# Patient Record
Sex: Male | Born: 1990 | Race: Black or African American | Hispanic: No | Marital: Married | Smoking: Former smoker
Health system: Southern US, Community
[De-identification: ages and names within clinical notes are randomized; demographics above are authoritative.]

## PROBLEM LIST (undated history)

## (undated) DIAGNOSIS — K219 Gastro-esophageal reflux disease without esophagitis: Secondary | ICD-10-CM

## (undated) DIAGNOSIS — N469 Male infertility, unspecified: Secondary | ICD-10-CM

## (undated) HISTORY — PX: OTHER SURGICAL HISTORY: SHX169

## (undated) HISTORY — PX: NO PAST SURGERIES: SHX2092

---

## 2017-08-11 ENCOUNTER — Ambulatory Visit
Admission: RE | Admit: 2017-08-11 | Discharge: 2017-08-11 | Disposition: A | Discharge status: Home / Self Care | Payer: No Typology Code available for payment source | Source: Ambulatory Visit | Attending: Occupational Medicine | Admitting: Occupational Medicine

## 2017-08-11 ENCOUNTER — Other Ambulatory Visit: Payer: Self-pay | Admitting: Occupational Medicine

## 2017-08-11 DIAGNOSIS — Z021 Encounter for pre-employment examination: Secondary | ICD-10-CM

## 2019-01-23 IMAGING — CR DG CHEST 1V
1 series · 1 of 1 positions shown · non-contrast
Comparison: None in PACs

CLINICAL DATA: PRE EMPLOYMENT examination for [HOSPITAL] police
department. Nonsmoker. No complaints.

EXAM:
CHEST 1 VIEW

[w chest pa]
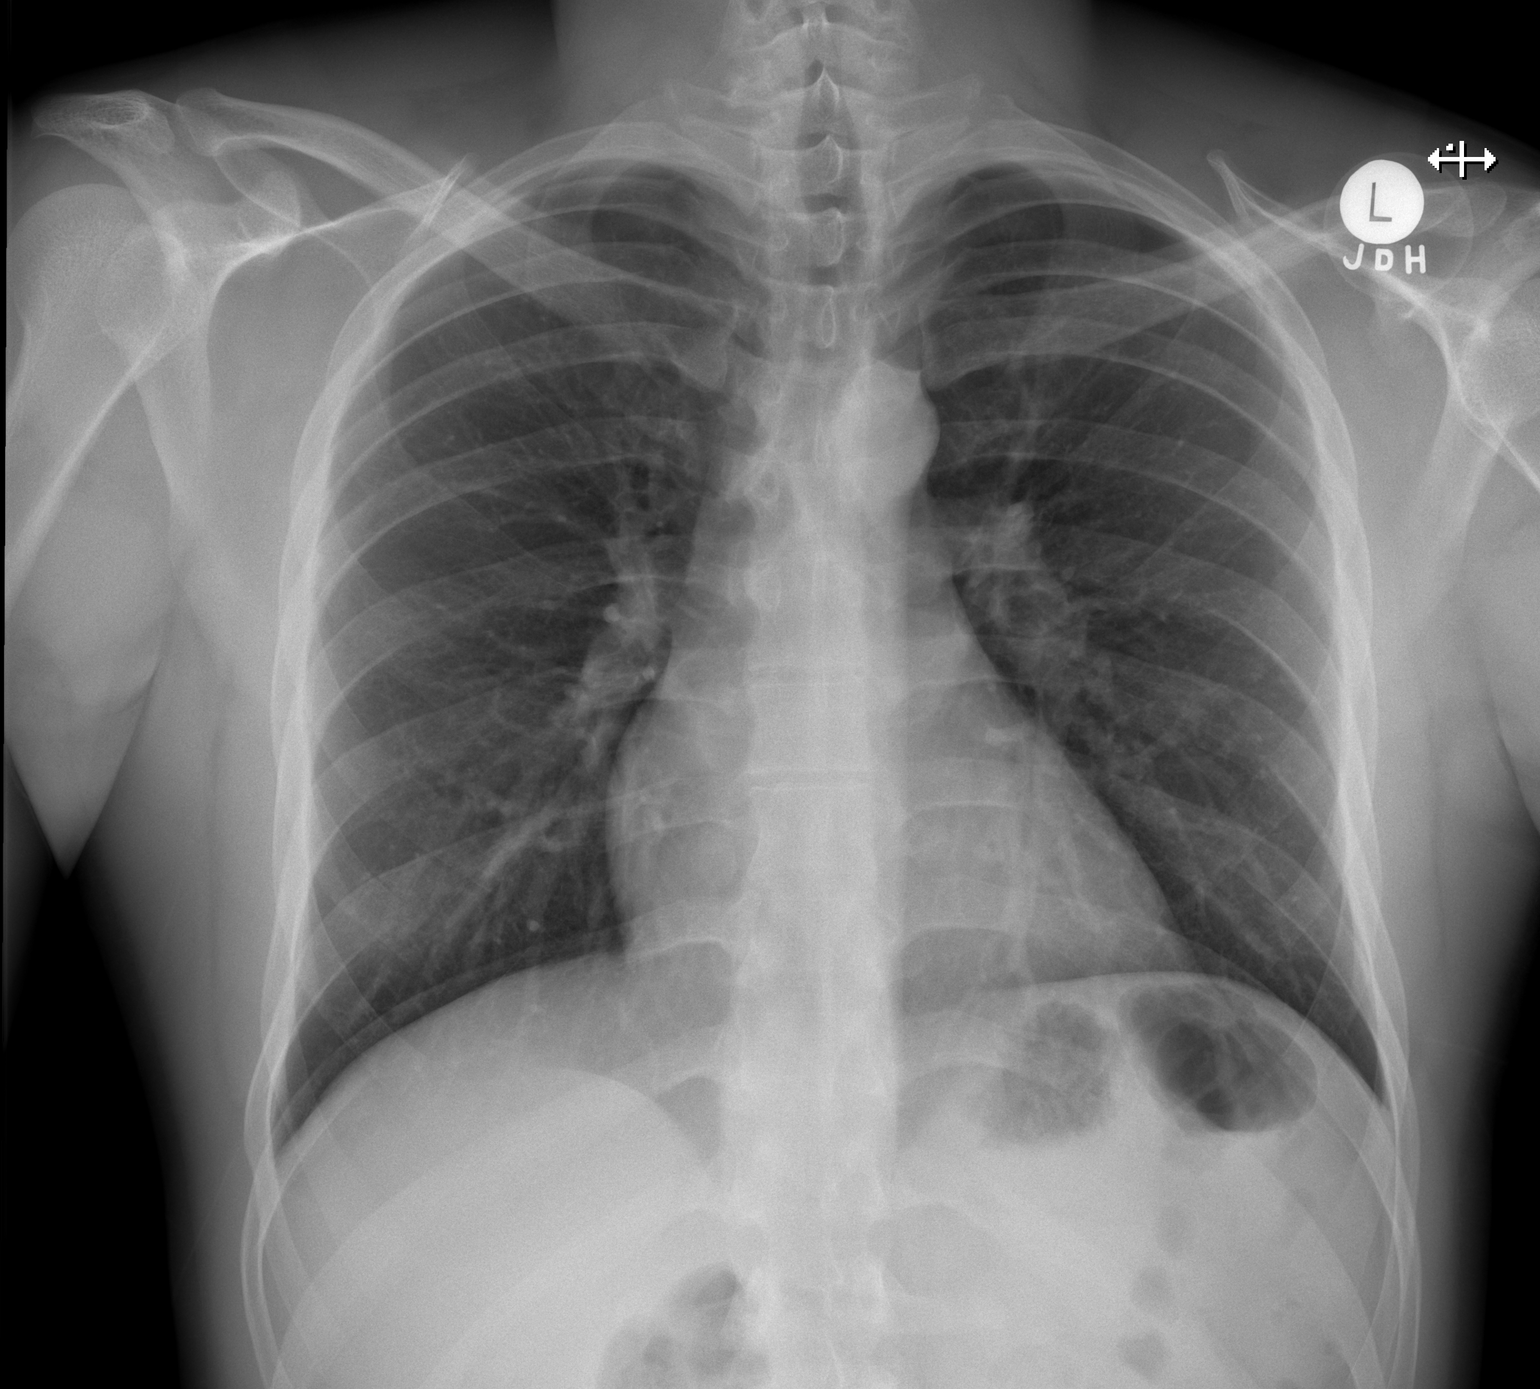

[1 of 1 positions shown; findings below may reference images not displayed]

FINDINGS: The lungs are well-expanded and clear. The heart and mediastinal
structures are normal. The bony thorax is unremarkable. Next there
is no active cardiopulmonary disease.
IMPRESSION: There is no active cardiopulmonary disease.

## 2019-04-29 ENCOUNTER — Other Ambulatory Visit: Payer: Self-pay | Admitting: Urology

## 2019-05-18 ENCOUNTER — Encounter (HOSPITAL_BASED_OUTPATIENT_CLINIC_OR_DEPARTMENT_OTHER): Payer: Self-pay | Admitting: *Deleted

## 2019-05-18 ENCOUNTER — Other Ambulatory Visit: Payer: Self-pay

## 2019-05-18 NOTE — Progress Notes (Signed)
Spoke with Romilda Garret, npo after midnight, no meds to take, arrive 1045 am 05-23-2019 [facility]. No labs needed. Has surgery orders in epic. Made covid test appointment 05-19-2019 at 48 am. Driver wife amber Mesenbrink or mother wilhemenia Bjorklund.

## 2019-05-19 ENCOUNTER — Other Ambulatory Visit (HOSPITAL_COMMUNITY)
Admission: RE | Admit: 2019-05-19 | Discharge: 2019-05-19 | Disposition: A | Discharge status: Home / Self Care | Payer: 59 | Source: Ambulatory Visit | Attending: Urology | Admitting: Urology

## 2019-05-19 DIAGNOSIS — Z20828 Contact with and (suspected) exposure to other viral communicable diseases: Secondary | ICD-10-CM | POA: Diagnosis not present

## 2019-05-19 DIAGNOSIS — Z01812 Encounter for preprocedural laboratory examination: Secondary | ICD-10-CM | POA: Diagnosis present

## 2019-05-20 LAB — NOVEL CORONAVIRUS, NAA (HOSP ORDER, SEND-OUT TO REF LAB; TAT 18-24 HRS): SARS-CoV-2, NAA: NOT DETECTED

## 2019-05-23 ENCOUNTER — Ambulatory Visit (HOSPITAL_BASED_OUTPATIENT_CLINIC_OR_DEPARTMENT_OTHER): Payer: 59 | Admitting: Anesthesiology

## 2019-05-23 ENCOUNTER — Other Ambulatory Visit: Payer: Self-pay

## 2019-05-23 ENCOUNTER — Encounter (HOSPITAL_BASED_OUTPATIENT_CLINIC_OR_DEPARTMENT_OTHER): Payer: Self-pay | Admitting: *Deleted

## 2019-05-23 ENCOUNTER — Ambulatory Visit (HOSPITAL_BASED_OUTPATIENT_CLINIC_OR_DEPARTMENT_OTHER)
Admission: RE | Admit: 2019-05-23 | Discharge: 2019-05-23 | Disposition: A | Discharge status: Home / Self Care | Payer: 59 | Attending: Urology | Admitting: Urology

## 2019-05-23 ENCOUNTER — Encounter (HOSPITAL_BASED_OUTPATIENT_CLINIC_OR_DEPARTMENT_OTHER)
Admission: RE | Disposition: A | Discharge status: Home / Self Care | Payer: Self-pay | Source: Home / Self Care | Attending: Urology

## 2019-05-23 DIAGNOSIS — N433 Hydrocele, unspecified: Secondary | ICD-10-CM | POA: Diagnosis not present

## 2019-05-23 DIAGNOSIS — N4611 Organic oligospermia: Secondary | ICD-10-CM | POA: Diagnosis not present

## 2019-05-23 DIAGNOSIS — Z87891 Personal history of nicotine dependence: Secondary | ICD-10-CM | POA: Diagnosis not present

## 2019-05-23 DIAGNOSIS — N469 Male infertility, unspecified: Secondary | ICD-10-CM | POA: Diagnosis present

## 2019-05-23 HISTORY — DX: Male infertility, unspecified: N46.9

## 2019-05-23 HISTORY — PX: TESTICLE BIOPSY: SHX471

## 2019-05-23 HISTORY — DX: Gastro-esophageal reflux disease without esophagitis: K21.9

## 2019-05-23 SURGERY — BIOPSY, TESTICLE
Anesthesia: General | Site: Scrotum | Laterality: Right

## 2019-05-23 MED ORDER — DEXAMETHASONE SODIUM PHOSPHATE 4 MG/ML IJ SOLN
INTRAMUSCULAR | Status: DC | PRN
  Administered 2019-05-23: 5 mg via INTRAVENOUS

## 2019-05-23 MED ORDER — CEFAZOLIN SODIUM-DEXTROSE 2-4 GM/100ML-% IV SOLN
2.0000 g | INTRAVENOUS | Status: AC
  Administered 2019-05-23: 2 g via INTRAVENOUS
  Filled 2019-05-23: qty 100

## 2019-05-23 MED ORDER — MIDAZOLAM HCL 2 MG/2ML IJ SOLN
INTRAMUSCULAR | Status: AC
  Filled 2019-05-23: qty 2

## 2019-05-23 MED ORDER — GLYCOPYRROLATE 0.2 MG/ML IJ SOLN
INTRAMUSCULAR | Status: DC | PRN
  Administered 2019-05-23: 0.2 mg via INTRAVENOUS

## 2019-05-23 MED ORDER — LIDOCAINE HCL (CARDIAC) PF 100 MG/5ML IV SOSY
PREFILLED_SYRINGE | INTRAVENOUS | Status: DC | PRN
  Administered 2019-05-23: 100 mg via INTRAVENOUS

## 2019-05-23 MED ORDER — LIDOCAINE 2% (20 MG/ML) 5 ML SYRINGE
INTRAMUSCULAR | Status: AC
  Filled 2019-05-23: qty 5

## 2019-05-23 MED ORDER — PROPOFOL 10 MG/ML IV BOLUS
INTRAVENOUS | Status: AC
  Filled 2019-05-23: qty 20

## 2019-05-23 MED ORDER — ACETAMINOPHEN 500 MG PO TABS
ORAL_TABLET | ORAL | Status: AC
  Filled 2019-05-23: qty 2

## 2019-05-23 MED ORDER — ONDANSETRON HCL 4 MG/2ML IJ SOLN
INTRAMUSCULAR | Status: DC | PRN
  Administered 2019-05-23: 4 mg via INTRAVENOUS

## 2019-05-23 MED ORDER — KETOROLAC TROMETHAMINE 30 MG/ML IJ SOLN
30.0000 mg | Freq: Once | INTRAMUSCULAR | Status: AC | PRN
  Administered 2019-05-23: 14:00:00 30 mg via INTRAVENOUS
  Filled 2019-05-23: qty 1

## 2019-05-23 MED ORDER — FENTANYL CITRATE (PF) 100 MCG/2ML IJ SOLN
INTRAMUSCULAR | Status: DC | PRN
  Administered 2019-05-23 (×2): 50 ug via INTRAVENOUS

## 2019-05-23 MED ORDER — MIDAZOLAM HCL 5 MG/5ML IJ SOLN
INTRAMUSCULAR | Status: DC | PRN
  Administered 2019-05-23: 2 mg via INTRAVENOUS

## 2019-05-23 MED ORDER — OXYCODONE HCL 5 MG/5ML PO SOLN
5.0000 mg | Freq: Once | ORAL | Status: DC | PRN
  Filled 2019-05-23: qty 5

## 2019-05-23 MED ORDER — FENTANYL CITRATE (PF) 100 MCG/2ML IJ SOLN
INTRAMUSCULAR | Status: AC
  Filled 2019-05-23: qty 2

## 2019-05-23 MED ORDER — DEXAMETHASONE SODIUM PHOSPHATE 10 MG/ML IJ SOLN
INTRAMUSCULAR | Status: AC
  Filled 2019-05-23: qty 1

## 2019-05-23 MED ORDER — HYDROMORPHONE HCL 1 MG/ML IJ SOLN
0.2500 mg | INTRAMUSCULAR | Status: DC | PRN
  Filled 2019-05-23: qty 0.5

## 2019-05-23 MED ORDER — ONDANSETRON HCL 4 MG/2ML IJ SOLN
INTRAMUSCULAR | Status: AC
  Filled 2019-05-23: qty 2

## 2019-05-23 MED ORDER — PROPOFOL 10 MG/ML IV BOLUS
INTRAVENOUS | Status: DC | PRN
  Administered 2019-05-23: 200 mg via INTRAVENOUS

## 2019-05-23 MED ORDER — KETOROLAC TROMETHAMINE 30 MG/ML IJ SOLN
INTRAMUSCULAR | Status: AC
  Filled 2019-05-23: qty 1

## 2019-05-23 MED ORDER — PROMETHAZINE HCL 25 MG/ML IJ SOLN
6.2500 mg | INTRAMUSCULAR | Status: DC | PRN
  Filled 2019-05-23: qty 1

## 2019-05-23 MED ORDER — OXYCODONE HCL 5 MG PO TABS
5.0000 mg | ORAL_TABLET | Freq: Once | ORAL | Status: DC | PRN
  Filled 2019-05-23: qty 1

## 2019-05-23 MED ORDER — CEFAZOLIN SODIUM-DEXTROSE 2-4 GM/100ML-% IV SOLN
INTRAVENOUS | Status: AC
  Filled 2019-05-23: qty 100

## 2019-05-23 MED ORDER — BUPIVACAINE HCL (PF) 0.25 % IJ SOLN
INTRAMUSCULAR | Status: DC | PRN
  Administered 2019-05-23: 10 mL

## 2019-05-23 MED ORDER — LACTATED RINGERS IV SOLN
INTRAVENOUS | Status: DC
  Administered 2019-05-23 (×2): via INTRAVENOUS
  Filled 2019-05-23: qty 1000

## 2019-05-23 MED ORDER — ACETAMINOPHEN 500 MG PO TABS
1000.0000 mg | ORAL_TABLET | Freq: Once | ORAL | Status: AC
  Administered 2019-05-23: 12:00:00 1000 mg via ORAL
  Filled 2019-05-23: qty 2

## 2019-05-23 MED ORDER — GLYCOPYRROLATE PF 0.2 MG/ML IJ SOSY
PREFILLED_SYRINGE | INTRAMUSCULAR | Status: AC
  Filled 2019-05-23: qty 1

## 2019-05-23 MED ORDER — OXYCODONE-ACETAMINOPHEN 5-325 MG PO TABS: 1.0000 | ORAL_TABLET | ORAL | 0 refills | Status: AC | PRN

## 2019-05-23 SURGICAL SUPPLY — 41 items
BLADE CLIPPER SENSICLIP SURGIC (BLADE) ×3 IMPLANT
BLADE SURG 15 STRL LF DISP TIS (BLADE) ×1 IMPLANT
BLADE SURG 15 STRL SS (BLADE) ×2
BNDG GAUZE ELAST 4 BULKY (GAUZE/BANDAGES/DRESSINGS) ×3 IMPLANT
CLEANER CAUTERY TIP 5X5 PAD (MISCELLANEOUS) ×1 IMPLANT
CLOTH BEACON ORANGE TIMEOUT ST (SAFETY) ×3 IMPLANT
COVER BACK TABLE 60X90IN (DRAPES) ×3 IMPLANT
COVER MAYO STAND STRL (DRAPES) ×3 IMPLANT
COVER WAND RF STERILE (DRAPES) ×3 IMPLANT
DERMABOND ADVANCED (GAUZE/BANDAGES/DRESSINGS) ×2
DERMABOND ADVANCED .7 DNX12 (GAUZE/BANDAGES/DRESSINGS) ×1 IMPLANT
DRAPE LAPAROTOMY 100X72 PEDS (DRAPES) ×3 IMPLANT
DRSG TEGADERM 4X4.75 (GAUZE/BANDAGES/DRESSINGS) IMPLANT
ELECT NEEDLE TIP 2.8 STRL (NEEDLE) ×3 IMPLANT
ELECT REM PT RETURN 9FT ADLT (ELECTROSURGICAL) ×3
ELECTRODE REM PT RTRN 9FT ADLT (ELECTROSURGICAL) ×1 IMPLANT
GLOVE BIO SURGEON STRL SZ8 (GLOVE) ×3 IMPLANT
GOWN STRL REUS W/ TWL XL LVL3 (GOWN DISPOSABLE) ×1 IMPLANT
GOWN STRL REUS W/TWL XL LVL3 (GOWN DISPOSABLE) ×2
KIT TURNOVER CYSTO (KITS) ×3 IMPLANT
NEEDLE HYPO 25X1 1.5 SAFETY (NEEDLE) ×3 IMPLANT
NS IRRIG 500ML POUR BTL (IV SOLUTION) ×3 IMPLANT
PACK BASIN DAY SURGERY FS (CUSTOM PROCEDURE TRAY) ×3 IMPLANT
PAD CLEANER CAUTERY TIP 5X5 (MISCELLANEOUS) ×2
PENCIL BUTTON HOLSTER BLD 10FT (ELECTRODE) ×3 IMPLANT
SUPPORT SCROTAL LG STRP (MISCELLANEOUS) ×2 IMPLANT
SUPPORTER ATHLETIC LG (MISCELLANEOUS) ×1
SUT CHROMIC 3 0 PS 2 (SUTURE) IMPLANT
SUT MNCRL AB 4-0 PS2 18 (SUTURE) ×3 IMPLANT
SUT VIC AB 2-0 SH 27 (SUTURE) ×2
SUT VIC AB 2-0 SH 27XBRD (SUTURE) ×1 IMPLANT
SUT VIC AB 3-0 SH 27 (SUTURE)
SUT VIC AB 3-0 SH 27X BRD (SUTURE) IMPLANT
SUT VIC AB 4-0 RB1 27 (SUTURE) ×2
SUT VIC AB 4-0 RB1 27X BRD (SUTURE) ×1 IMPLANT
SYR CONTROL 10ML LL (SYRINGE) ×3 IMPLANT
TOWEL OR 17X26 10 PK STRL BLUE (TOWEL DISPOSABLE) ×3 IMPLANT
TRAY DSU PREP LF (CUSTOM PROCEDURE TRAY) ×3 IMPLANT
TUBE CONNECTING 12'X1/4 (SUCTIONS)
TUBE CONNECTING 12X1/4 (SUCTIONS) IMPLANT
YANKAUER SUCT BULB TIP NO VENT (SUCTIONS) IMPLANT

## 2019-05-23 NOTE — H&P (Signed)
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 NO chemical exposure Wife's workup was normal, No LUTS.  Past Medical History:  Diagnosis Date  . GERD (gastroesophageal reflux disease)   . Infertility male    Past Surgical History:  Procedure Laterality Date  .  STITCHES AS CHILD    . NO PAST SURGERIES      Home Medications:  Current Facility-Administered Medications  Medication Dose Route Frequency Provider Last Rate Last Dose  . ceFAZolin (ANCEF) IVPB 2g/100 mL premix  2 g Intravenous 30 min Pre-Op Aerica Rincon, Mardene CelestePatrick L, MD      . lactated ringers infusion   Intravenous Continuous Willette AlmaWoodrum, Chelsey L, MD 50 mL/hr at 05/23/19 1150     Allergies: No Known Allergies  History reviewed. No pertinent family history. Social History:  reports that he has quit smoking. His smoking use included cigarettes. He has a 0.25 pack-year smoking history. He has never used smokeless tobacco. He reports current alcohol use. He reports that he does not use drugs.  Review of Systems  All other systems reviewed and are negative.   Physical Exam:  Vital signs in last 24 hours: Temp:  [98.2 F (36.8 C)] 98.2 F (36.8 C) (09/14 1116) Pulse Rate:  [66] 66 (09/14 1116) Resp:  [18] 18 (09/14 1101) BP: (133)/(84) 133/84 (09/14 1116) SpO2:  [100 %] 100 % (09/14 1116) Weight:  [84.6 kg] 84.6 kg (09/14 1116) Physical Exam  Constitutional: He is oriented to person, place, and time. He appears well-developed and well-nourished.  HENT:  Head: Normocephalic and atraumatic.  Eyes: Pupils are equal, round, and reactive to light. EOM are normal.  Neck: Normal range of motion. No thyromegaly present.  Cardiovascular: Normal rate and regular rhythm.  Respiratory: Effort normal. No respiratory distress.  GI: Soft. He exhibits no distension.  Musculoskeletal: Normal range of  motion.        General: No edema.  Neurological: He is alert and oriented to person, place, and time.  Skin: Skin is warm and dry.  Psychiatric: He has a normal mood and affect. His behavior is normal. Judgment and thought content normal.    Laboratory Data:  No results found for this or any previous visit (from the past 24 hour(s)). Recent Results (from the past 240 hour(s))  Novel Coronavirus, NAA (Hosp order, Send-out to Ref Lab; TAT 18-24 hrs     Status: None   Collection Time: 05/19/19  8:25 AM   Specimen: Nasopharyngeal Swab; Respiratory  Result Value Ref Range Status   SARS-CoV-2, NAA NOT DETECTED NOT DETECTED Final    Comment: (NOTE) This nucleic acid amplification test was developed and its performance characteristics determined by [facility]. Nucleic acid amplification tests include PCR and TMA. This test has not been FDA cleared or approved. This test has been authorized by FDA under an Emergency Use Authorization (EUA). This test is only authorized for the duration of time the declaration that circumstances exist justifying the authorization of the emergency use of in vitro diagnostic tests for detection of SARS-CoV-2 virus and/or diagnosis of COVID-19 infection under section 564(b)(1) of the Act, 21 U.S.C. 161WRU-0(A360bbb-3(b) (1), unless the authorization is terminated or revoked sooner. When diagnostic testing is negative, the possibility of a false negative result should be considered in the context of a patient's recent exposures and the presence of clinical signs and symptoms consistent  with COVID-19. An individual without symptoms of COVID- 19 and who is not shedding SARS-CoV-2 vi rus would expect to have a negative (not detected) result in this assay. Performed At: [facility] [address] [place], [place] [id number] Rush Farmer MD GM:[id number]    [place]  Final    Comment: Performed at [facility], [place] [address], [place], [place] [address]   Creatinine: No results for input(s): CREATININE in the last 168 hours. Baseline Creatinine: unknwon  Impression/Assessment:  27yo with oligospermia  Plan:  The risks/benefits/alternatives to testis biopsy was explained to the patient and he understands and wishes to proceed with surgery  Nicolette Bang 05/23/2019, 12:20 PM

## 2019-05-23 NOTE — Transfer of Care (Signed)
Immediate Anesthesia Transfer of Care Note  Patient: Eugene Herrera  Procedure(s) Performed: BIOPSY TESTICULAR (Right Scrotum)  Patient Location: PACU  Anesthesia Type:General  Level of Consciousness: awake, alert  and oriented  Airway & Oxygen Therapy: Patient Spontanous Breathing and Patient connected to nasal cannula oxygen  Post-op Assessment: Report given to RN and Post -op Vital signs reviewed and stable  Post vital signs: Reviewed and stable  Last Vitals:  Vitals Value Taken Time  BP 127/70 05/23/19 1321  Temp    Pulse 71 05/23/19 1323  Resp    SpO2 100 % 05/23/19 1323  Vitals shown include unvalidated device data.  Last Pain:  Vitals:   05/23/19 1116  TempSrc: Oral  PainSc: 0-No pain      Patients Stated Pain Goal: 8 (57/32/20 2542)  Complications: No apparent anesthesia complications

## 2019-05-23 NOTE — Op Note (Signed)
Preoperative diagnosis: infertility  Postoperative diagnosis: Same, small right hydrocele  Procedure: 1. Excision of right appendix testis 2. Right hydrocelectomy 3. Testis biopsy  Attending: Nicolette Bang, MD  Anesthesia: General  History of blood loss: Minimal  Antibiotics: ancef  Drains: none  Specimens: 1. right seminiferous tubules   Findings: small volume testis. Small right hydrocele  Indications: Patient is a 28 year old male with a history infertility with a high FSH.  We discussed the treatment options including observation versus testis biopsy after discussing treatment options he proceed with testis biopsy.   Procedure in detail: Prior to procedure consent was obtained.  Patient was brought to the operating room and a brief timeout was done to ensure correct patient, correct procedure, correct site.  General anesthesia was administered and patient was placed in supine position.  His genitalia was then prepped and draped in usual sterile fashion.  A 3 cm incision was made in the right hemiscrotum.  We dissected down to the tunica and then incised the tunica. A small hydrocele was encountered and was drained. We then excised the hydrocele sac and then over sewed the edge with 2-0 Vicryl in a running fashion. We then excised the right appendix testis. Using sharp dissection the tunica albuginea was opened and multiple packets of seminiferous tubules were sent for pathology.  Hemostasis was then obtained with electrocautery. We then closed the defect with 4-0 vicryl in a running fashion. We then returned the testis to the left hemiscrotum and closed the overlying dartos with 2-0 vicryl in a running fashion. The skin was then closed with 4-0 monocryl in a running fashion. Dermabond was placed on the incision.  A dressing was then applied to the incision.  We then placed a scrotal fluff and this then concluded the procedure which was well tolerated by the patient.  Complications:  None  Condition: Stable, extubated, transferred to PACU.  Plan: Patient is to be discharged home.  He is to follow up in 2 weeks for wound check and pathology discussion

## 2019-05-23 NOTE — Discharge Instructions (Signed)
Hydrocelectomy, Adult, Care After °This sheet gives you information about how to care for yourself after your procedure. Your health care provider may also give you more specific instructions. If you have problems or questions, contact your health care provider. °What can I expect after the procedure? °After your procedure, it is common to have mild discomfort, swelling, and bruising in the pouch that holds your testicles (scrotum). °Follow these instructions at home: °Bathing °· Ask your health care provider when you can shower, take baths, or go swimming. °· If you were told to wear an athletic support strap, take it off when you shower or take a bath. °Incision care ° °· Follow instructions from your health care provider about how to take care of your incision. Make sure you: °? Wash your hands with soap and water before you change your bandage (dressing). If soap and water are not available, use hand sanitizer. °? Change your dressing as told by your health care provider. °? Leave stitches (sutures) in place. °· Check your incision and scrotum every day for signs of infection. Check for: °? More redness, swelling, or pain. °? Blood or fluid. °? Warmth. °? Pus or a bad smell. °Managing pain, stiffness, and swelling °· If directed, apply ice to the injured area: °? Put ice in a plastic bag. °? Place a towel between your skin and the bag. °? Leave the ice on for 20 minutes, 2-3 times per day. °Driving °· Do not drive for 24 hours if you were given a sedative. °· Do not drive or use heavy machinery while taking prescription pain medicine. °· Ask your health care provider when it is safe to drive. °Activity °· Do not do any activities that require great strength and energy (are vigorous) for as long as told by your health care provider. °· Return to your normal activities as told by your health care provider. Ask your health care provider what activities are safe for you. °· Do not lift anything that is heavier than  10 lb (4.5 kg) until your health care provider says that it is safe. °General instructions °· Take over-the-counter and prescription medicines only as told by your health care provider. °· Keep all follow-up visits as told by your health care provider. This is important. °· If you were given an athletic support strap, wear it as told by your health care provider. °· If you had a drain put in during the procedure, you will need to return for a follow-up visit to have it removed. °Contact a health care provider if: °· Your pain is not controlled with medicine. °· You have more redness or swelling around your scrotum. °· You have blood or fluid coming from your scrotum. °· Your incision feels warm to the touch. °· You have pus or a bad smell coming from your scrotum. °· You have a fever. °This information is not intended to replace advice given to you by your health care provider. Make sure you discuss any questions you have with your health care provider. °Document Released: 05/16/2015 Document Revised: 08/07/2017 Document Reviewed: 05/24/2016 °Elsevier Patient Education © 2020 [employer] ° ° °Post Anesthesia Home Care Instructions ° °Activity: °Get plenty of rest for the remainder of the day. A responsible individual must stay with you for 24 hours following the procedure.  °For the next 24 hours, DO NOT: °-Drive a car °-Operate machinery °-Drink alcoholic beverages °-Take any medication unless instructed by your physician °-Make any legal decisions or sign important   papers. ° °Meals: °Start with liquid foods such as gelatin or soup. Progress to regular foods as tolerated. Avoid greasy, spicy, heavy foods. If nausea and/or vomiting occur, drink only clear liquids until the nausea and/or vomiting subsides. Call your physician if vomiting continues. ° °Special Instructions/Symptoms: °Your throat may feel dry or sore from the anesthesia or the breathing tube placed in your throat during surgery. If this causes  discomfort, gargle with warm salt water. The discomfort should disappear within 24 hours. ° °If you had a scopolamine patch placed behind your ear for the management of post- operative nausea and/or vomiting: ° °1. The medication in the patch is effective for 72 hours, after which it should be removed.  Wrap patch in a tissue and discard in the trash. Wash hands thoroughly with soap and water. °2. You may remove the patch earlier than 72 hours if you experience unpleasant side effects which may include dry mouth, dizziness or visual disturbances. °3. Avoid touching the patch. Wash your hands with soap and water after contact with the patch. °   ° °

## 2019-05-23 NOTE — Anesthesia Preprocedure Evaluation (Signed)
Anesthesia Evaluation  Patient identified by MRN, date of birth, ID band Patient awake    Reviewed: Allergy & Precautions, NPO status , Patient's Chart, lab work & pertinent test results  Airway Mallampati: II  TM Distance: >3 FB Neck ROM: Full    Dental no notable dental hx.    Pulmonary former smoker,    Pulmonary exam normal breath sounds clear to auscultation       Cardiovascular negative cardio ROS Normal cardiovascular exam Rhythm:Regular Rate:Normal     Neuro/Psych negative neurological ROS  negative psych ROS   GI/Hepatic negative GI ROS, Neg liver ROS,   Endo/Other  negative endocrine ROS  Renal/GU negative Renal ROS     Musculoskeletal negative musculoskeletal ROS (+)   Abdominal   Peds  Hematology negative hematology ROS (+)   Anesthesia Other Findings INFERTILITY  Reproductive/Obstetrics                             Anesthesia Physical Anesthesia Plan  ASA: II  Anesthesia Plan: General   Post-op Pain Management:    Induction: Intravenous  PONV Risk Score and Plan: Ondansetron, Dexamethasone, Midazolam and Treatment may vary due to age or medical condition  Airway Management Planned: LMA  Additional Equipment:   Intra-op Plan:   Post-operative Plan: Extubation in OR  Informed Consent: I have reviewed the patients History and Physical, chart, labs and discussed the procedure including the risks, benefits and alternatives for the proposed anesthesia with the patient or authorized representative who has indicated his/her understanding and acceptance.     Dental advisory given  Plan Discussed with: CRNA  Anesthesia Plan Comments:         Anesthesia Quick Evaluation

## 2019-05-23 NOTE — Anesthesia Procedure Notes (Signed)
Procedure Name: LMA Insertion Date/Time: 05/23/2019 12:35 PM Performed by: Bufford Spikes, CRNA Pre-anesthesia Checklist: Patient identified, Emergency Drugs available, Suction available and Patient being monitored Patient Re-evaluated:Patient Re-evaluated prior to induction Oxygen Delivery Method: Circle system utilized Preoxygenation: Pre-oxygenation with 100% oxygen Induction Type: IV induction Ventilation: Mask ventilation without difficulty LMA: LMA inserted LMA Size: 4.0 Number of attempts: 1 Airway Equipment and Method: Bite block Placement Confirmation: positive ETCO2 Tube secured with: Tape Dental Injury: Teeth and Oropharynx as per pre-operative assessment

## 2019-05-23 NOTE — Anesthesia Postprocedure Evaluation (Signed)
Anesthesia Post Note  Patient: Eugene Herrera  Procedure(s) Performed: BIOPSY TESTICULAR (Right Scrotum)     Patient location during evaluation: PACU Anesthesia Type: General Level of consciousness: awake and alert Pain management: pain level controlled Vital Signs Assessment: post-procedure vital signs reviewed and stable Respiratory status: spontaneous breathing, nonlabored ventilation, respiratory function stable and patient connected to nasal cannula oxygen Cardiovascular status: blood pressure returned to baseline and stable Postop Assessment: no apparent nausea or vomiting Anesthetic complications: no    Last Vitals:  Vitals:   05/23/19 1435 05/23/19 1530  BP: 115/65 119/80  Pulse: (!) 52 (!) 50  Resp: (!) 21 14  Temp:  36.7 C  SpO2: 99% 98%    Last Pain:  Vitals:   05/23/19 1515  TempSrc:   PainSc: 3                  Ryan P Ellender

## 2019-05-24 ENCOUNTER — Encounter (HOSPITAL_BASED_OUTPATIENT_CLINIC_OR_DEPARTMENT_OTHER): Payer: Self-pay | Admitting: Urology

## 2019-05-24 NOTE — Progress Notes (Signed)
Phone not working
